# Patient Record
Sex: Male | Born: 1990 | Race: White | Hispanic: No | Marital: Single | Smoking: Never smoker
Health system: Southern US, Community
[De-identification: ages and names within clinical notes are randomized; demographics above are authoritative.]

## PROBLEM LIST (undated history)

## (undated) DIAGNOSIS — I10 Essential (primary) hypertension: Secondary | ICD-10-CM

## (undated) HISTORY — PX: TONSILLECTOMY: SUR1361

---
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
## 2011-12-28 ENCOUNTER — Emergency Department (HOSPITAL_COMMUNITY)
Admission: EM | Admit: 2011-12-28 | Discharge: 2011-12-29 | Disposition: A | Discharge status: Home / Self Care | Payer: Worker's Compensation | Attending: Emergency Medicine | Admitting: Emergency Medicine

## 2011-12-28 ENCOUNTER — Encounter (HOSPITAL_COMMUNITY): Payer: Self-pay | Admitting: Emergency Medicine

## 2011-12-28 ENCOUNTER — Emergency Department (HOSPITAL_COMMUNITY): Payer: Worker's Compensation

## 2011-12-28 DIAGNOSIS — W292XXA Contact with other powered household machinery, initial encounter: Secondary | ICD-10-CM | POA: Insufficient documentation

## 2011-12-28 DIAGNOSIS — Y990 Civilian activity done for income or pay: Secondary | ICD-10-CM | POA: Insufficient documentation

## 2011-12-28 DIAGNOSIS — S61209A Unspecified open wound of unspecified finger without damage to nail, initial encounter: Secondary | ICD-10-CM

## 2011-12-28 DIAGNOSIS — Z23 Encounter for immunization: Secondary | ICD-10-CM | POA: Insufficient documentation

## 2011-12-28 DIAGNOSIS — Y9269 Other specified industrial and construction area as the place of occurrence of the external cause: Secondary | ICD-10-CM | POA: Insufficient documentation

## 2011-12-28 DIAGNOSIS — Y9389 Activity, other specified: Secondary | ICD-10-CM | POA: Insufficient documentation

## 2011-12-28 MED ORDER — "THROMBI-PAD 3""X3"" EX PADS"
1.0000 | MEDICATED_PAD | Freq: Once | CUTANEOUS | Status: AC
  Administered 2011-12-28: 1 via TOPICAL

## 2011-12-28 MED ORDER — TETANUS-DIPHTH-ACELL PERTUSSIS 5-2.5-18.5 LF-MCG/0.5 IM SUSP
0.5000 mL | Freq: Once | INTRAMUSCULAR | Status: AC
  Administered 2011-12-28: 0.5 mL via INTRAMUSCULAR
  Filled 2011-12-28: qty 0.5

## 2011-12-28 NOTE — ED Notes (Signed)
Bleeding controlled at this time.

## 2011-12-28 NOTE — ED Notes (Signed)
Surgicel applied to right hand, index finger. Pt finger wrapped in coban and applied pressure

## 2011-12-28 NOTE — ED Notes (Signed)
MD at bedside. 

## 2011-12-28 NOTE — ED Provider Notes (Signed)
History     CSN: [id number]  Arrival date & time 12/28/11  1478   First MD Initiated Contact with Patient 12/28/11 2048      Chief Complaint  Patient presents with  . Laceration    (Consider location/radiation/quality/duration/timing/severity/associated sxs/prior treatment) HPI History provided by pt.   Pt was using a meat slicer at work this evening, and sustained an avulsion of tuft of right index finger.  Bleeding is not controlled and pain is severe.  No associated paresthesias.  Pt is not anti-coagulated.  Most recent tetanus unknown.  History reviewed. No pertinent past medical history.  History reviewed. No pertinent past surgical history.  No family history on file.  History  Substance Use Topics  . Smoking status: Never Smoker   . Smokeless tobacco: Not on file  . Alcohol Use: No      Review of Systems  All other systems reviewed and are negative.    Allergies  Review of patient's allergies indicates no known allergies.  Home Medications  No current outpatient prescriptions on file.  BP 167/101  Pulse 99  Temp 98.1 F (36.7 C) (Oral)  Resp 24  SpO2 100%  Physical Exam  Nursing note and vitals reviewed. Constitutional: He is oriented to person, place, and time. He appears well-developed and well-nourished. No distress.  HENT:  Head: Normocephalic and atraumatic.  Eyes:       Normal appearance  Neck: Normal range of motion.  Pulmonary/Chest: Effort normal.  Musculoskeletal: Normal range of motion.       Avulsion of tuft of right index finger, ~2cm in diameter, not affecting the nailbed.  Oozing blood. Sensation intact; severely ttp.    Neurological: He is alert and oriented to person, place, and time.  Psychiatric: He has a normal mood and affect. His behavior is normal.    ED Course  Procedures (including critical care time)  Labs Reviewed - No data to display Dg Finger Index Right  12/28/2011  *RADIOLOGY REPORT*  Clinical Data: Soft  tissue injury  RIGHT INDEX FINGER 2+V  Comparison: None.  Findings: There is amputation deformity of the soft tissues of the distal lateral tip of the index finger.  No evidence of fracture. No radiopaque foreign object.  IMPRESSION: Partial soft tissue amputation at the tip of the index finger.   Original Report Authenticated By: Nicolas Adkins, Adkins.D.      1. Avulsion of fingertip       MDM  Healthy Nicolas Adkins presents w/ right index finger soft tissue tuft avulsion.  Xray neg for fx.  Bleeding difficult to control.  We have had patient elevate and hold pressure and applied both qwik clot and thrombi pad w/out improvement.  He is currently holding surgicel on fingertip.  Digital block performed w/ 5mL 2% lidocaine w/out epi.  Tetanus has been updated.  Will reassess shortly.    Bleeding stopped w/ surgicel.  Pt instructed to keep surgicel in place for 24 hours.  Nursing staff applied a pressure dressing.  Pt d/c'd home w/ vicodin for pain.  We discussed what to do if he begins to bleed again as well as return precautions.  12:48 AM      Otilio Miu, PA-C 12/29/11 [phone]

## 2011-12-28 NOTE — ED Notes (Signed)
PT. PRESENTS WITH RIGHT DISTAL INDEX FINGER LACERATION SUSTAINED AT WORK ( [employer]) WHILE USING MEAT SLICER , DRESSINGS APPLIED PTA, NO BLEEDING NOTED.

## 2011-12-28 NOTE — ED Notes (Signed)
Pt has index finger to right hand wrapped with ace bandage and elevated. A&Ox4, ambulatory, nad.

## 2011-12-29 MED ORDER — OXYCODONE-ACETAMINOPHEN 5-325 MG PO TABS
1.0000 | ORAL_TABLET | Freq: Once | ORAL | Status: AC
  Administered 2011-12-29: 1 via ORAL
  Filled 2011-12-29: qty 1

## 2011-12-29 MED ORDER — HYDROCODONE-ACETAMINOPHEN 5-325 MG PO TABS: 1.0000 | ORAL_TABLET | ORAL | Status: AC | PRN

## 2011-12-29 NOTE — ED Provider Notes (Signed)
Medical screening examination/treatment/procedure(s) were performed by non-physician practitioner and as supervising physician I was immediately available for consultation/collaboration.  Flint Melter, MD 12/29/11 [phone]

## 2011-12-29 NOTE — ED Notes (Signed)
RN and PA reviewed patient finger for the bleeding to stop. Bleeding under control. Pressure dressing applied to finger again with surgicel. Pt and family teaching on care when discharged. Pt and family stated they understood all discharge instructions.

## 2014-01-24 IMAGING — CR DG FINGER INDEX 2+V*R*
3 series · 3 of 3 positions shown · non-contrast
Comparison: None.

CLINICAL DATA: Soft tissue injury

RIGHT INDEX FINGER 2+V

[x finger obl. right (1 of 2)]
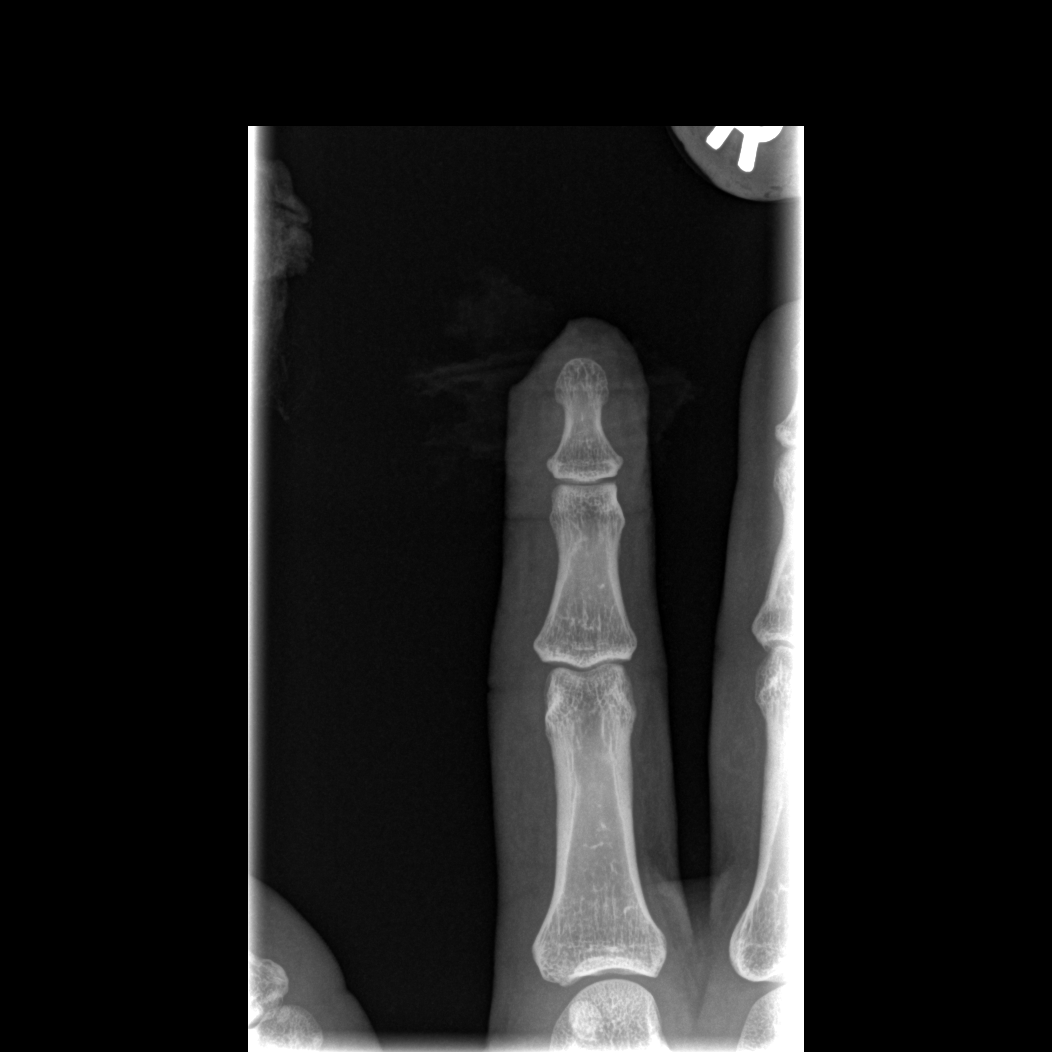

[x finger obl. right (2 of 2)]
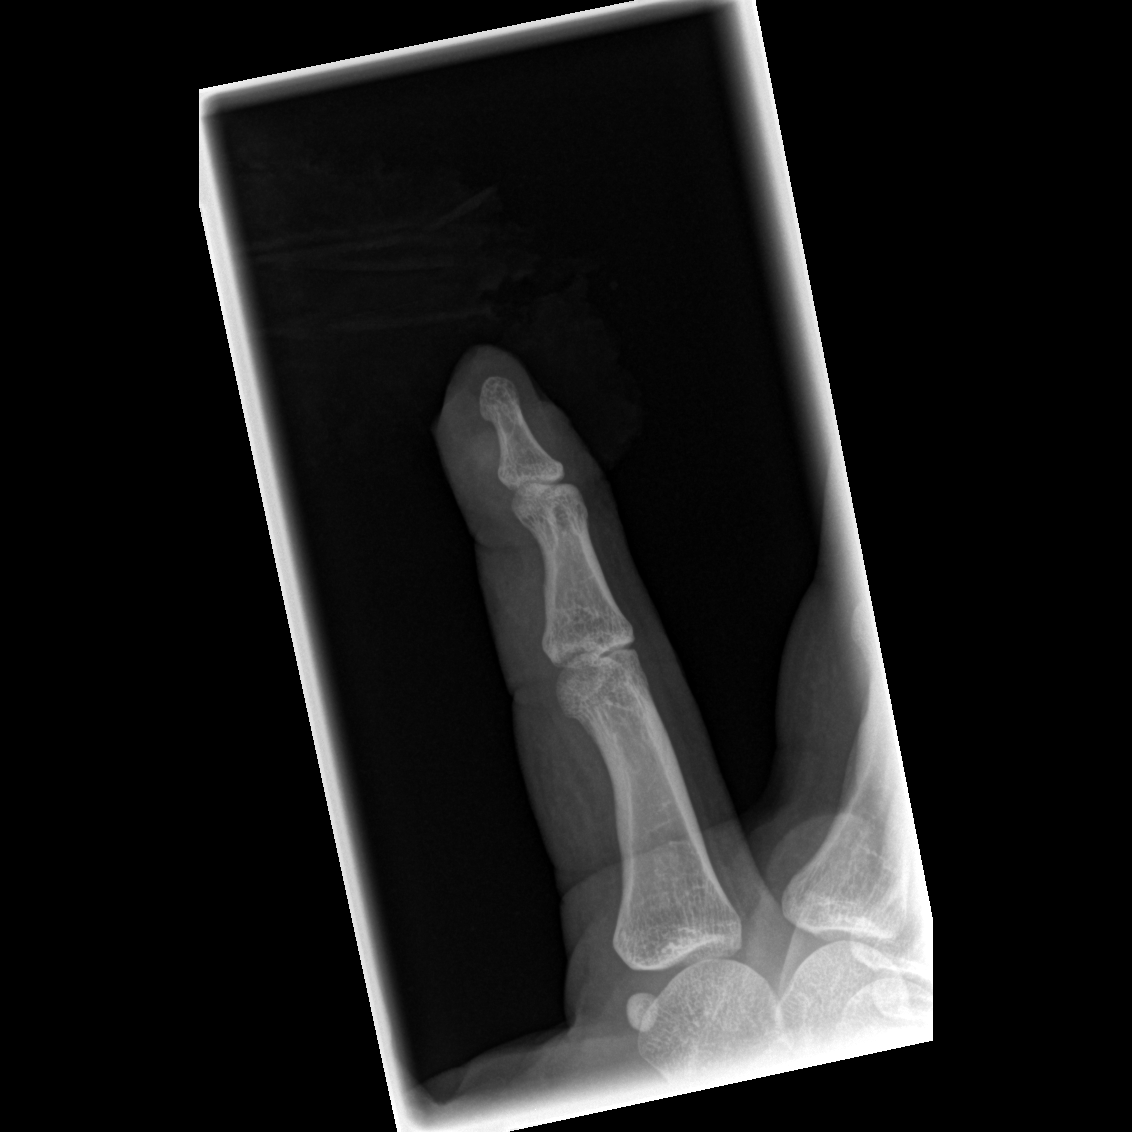

[x finger lateral right]
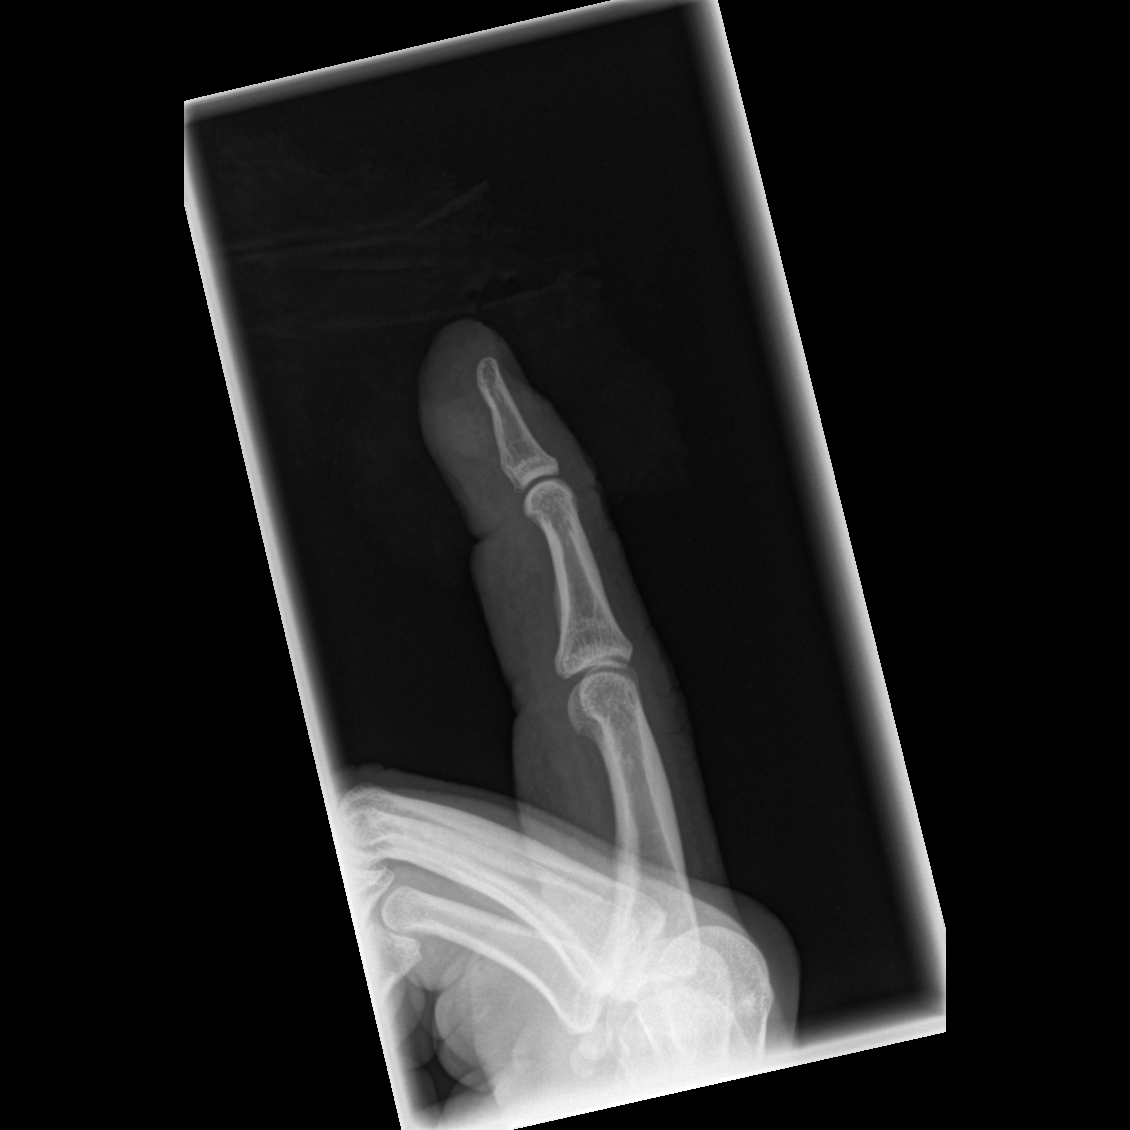

[3 of 3 positions shown; findings below may reference images not displayed]

FINDINGS: There is amputation deformity of the soft tissues of the
distal lateral tip of the index finger.  No evidence of fracture.
No radiopaque foreign object.
IMPRESSION: Partial soft tissue amputation at the tip of the index finger.

## 2014-11-23 ENCOUNTER — Encounter (HOSPITAL_BASED_OUTPATIENT_CLINIC_OR_DEPARTMENT_OTHER): Payer: Self-pay | Admitting: Emergency Medicine

## 2014-11-23 ENCOUNTER — Emergency Department (HOSPITAL_BASED_OUTPATIENT_CLINIC_OR_DEPARTMENT_OTHER)
Admission: EM | Admit: 2014-11-23 | Discharge: 2014-11-23 | Disposition: A | Discharge status: Home / Self Care | Payer: Worker's Compensation | Attending: Emergency Medicine | Admitting: Emergency Medicine

## 2014-11-23 ENCOUNTER — Emergency Department (HOSPITAL_BASED_OUTPATIENT_CLINIC_OR_DEPARTMENT_OTHER): Payer: Worker's Compensation

## 2014-11-23 DIAGNOSIS — W1839XA Other fall on same level, initial encounter: Secondary | ICD-10-CM | POA: Diagnosis not present

## 2014-11-23 DIAGNOSIS — I10 Essential (primary) hypertension: Secondary | ICD-10-CM | POA: Diagnosis not present

## 2014-11-23 DIAGNOSIS — Y9289 Other specified places as the place of occurrence of the external cause: Secondary | ICD-10-CM | POA: Diagnosis not present

## 2014-11-23 DIAGNOSIS — S8992XA Unspecified injury of left lower leg, initial encounter: Secondary | ICD-10-CM | POA: Diagnosis present

## 2014-11-23 DIAGNOSIS — S8002XA Contusion of left knee, initial encounter: Secondary | ICD-10-CM

## 2014-11-23 DIAGNOSIS — Y990 Civilian activity done for income or pay: Secondary | ICD-10-CM | POA: Insufficient documentation

## 2014-11-23 DIAGNOSIS — Y9339 Activity, other involving climbing, rappelling and jumping off: Secondary | ICD-10-CM | POA: Diagnosis not present

## 2014-11-23 HISTORY — DX: Essential (primary) hypertension: I10

## 2014-11-23 NOTE — ED Provider Notes (Signed)
CSN: [id number]     Arrival date & time 11/23/14  2011 History  By signing my name below, I, Octavia Heirrianna Nassar, attest that this documentation has been prepared under the direction and in the presence of Tilden FossaElizabeth Tennis Mckinnon, MD. Electronically Signed: Octavia HeirArianna Nassar, ED Scribe. 11/23/2014. 10:31 PM.    Chief Complaint  Patient presents with  . Leg Injury    left      The history is provided by the patient. No language interpreter was used.   HPI Comments: Nicolas NordmannChristopher A Adkins is a 24 y.o. male who presents to the Emergency Department complaining of a constant, sudden, gradual worsening left knee injury onset about 3 hours ago. Pt states he was at work when he jumped off of a piece of equipment and landed directly on concrete. He states he is unable to apply pressure and reports increased pain when ambulating. Pt denies any prior injury to his knee.   Past Medical History  Diagnosis Date  . Hypertension    Past Surgical History  Procedure Laterality Date  . Tonsillectomy     History reviewed. No pertinent family history. Social History  Substance Use Topics  . Smoking status: Never Smoker   . Smokeless tobacco: None  . Alcohol Use: No    Review of Systems  Musculoskeletal: Positive for arthralgias.  All other systems reviewed and are negative.     Allergies  Review of patient's allergies indicates no known allergies.  Home Medications   Prior to Admission medications   Medication Sig Start Date End Date Taking? Authorizing Provider  HYDROcodone-acetaminophen (NORCO/VICODIN) 5-325 MG per tablet Take 1 tablet by mouth every 4 (four) hours as needed for pain. 12/29/11   Ruby Colaatherine Schinlever, PA-C   Triage vitals: BP 149/103 mmHg  Pulse 67  Temp(Src) 97.8 F (36.6 C)  Resp 20  Ht 5\' 10"  (1.778 m)  Wt 210 lb (95.255 kg)  BMI 30.13 kg/m2  SpO2 97% Physical Exam  Constitutional: He is oriented to person, place, and time. He appears well-developed and well-nourished.  HENT:   Head: Normocephalic and atraumatic.  Cardiovascular: Normal rate and regular rhythm.   Pulmonary/Chest: Effort normal. No respiratory distress.  Musculoskeletal: He exhibits edema and tenderness.  Swelling and tenderness to the anterior left knee, 2+ DP pulses.  Able to fully extend knee.  Pain with weight bearing.    Neurological: He is alert and oriented to person, place, and time.  Skin: Skin is warm and dry.  Psychiatric: He has a normal mood and affect. His behavior is normal.  Nursing note and vitals reviewed.   ED Course  Procedures  DIAGNOSTIC STUDIES: Oxygen Saturation is 97% on RA, normal by my interpretation.  COORDINATION OF CARE:  10:24 PM Discussed treatment plan with pt at bedside and pt agreed to plan.  Labs Review Labs Reviewed - No data to display  Imaging Review Dg Knee Complete 4 Views Left  11/23/2014  CLINICAL DATA:  Fall with injury of the left knee tonight. Abrasions along the patella. EXAM: LEFT KNEE - COMPLETE 4+ VIEW COMPARISON:  None. FINDINGS: Prepatellar stranding compatible with subcutaneous edema. Suprapatellar bursa indistinct but I do not see a definite knee effusion. No fracture identified. IMPRESSION: 1. Prepatellar edema. No discrete fracture. No definite knee effusion. Electronically Signed   By: Gaylyn RongWalter  Liebkemann M.D.   On: 11/23/2014 22:12   I have personally reviewed and evaluated these images and lab results as part of my medical decision-making.   EKG Interpretation None  MDM   Final diagnoses:  Knee contusion, left, initial encounter   Patient here for evaluation of left knee injury at work. There is no evidence of acute fracture or dislocation on examination. He does have local tenderness with range of motion and weightbearing. We'll place an knee immobilizer with crutches for weightbearing as tolerated and outpatient follow-up.  I personally performed the services described in this documentation, which was scribed in my  presence. The recorded information has been reviewed and is accurate.   Tilden Fossa, MD 11/24/14 0001

## 2014-11-23 NOTE — ED Notes (Signed)
Patient states he was at work and a plastic pallettes fell towards him and he jump to get away from them and fell on the concrete with his left knee.

## 2014-11-23 NOTE — Discharge Instructions (Signed)

## 2016-12-20 IMAGING — DX DG KNEE COMPLETE 4+V*L*
4 series · 4 of 4 positions shown · non-contrast
Comparison: None.

CLINICAL DATA: Fall with injury of the left knee tonight. Abrasions
along the patella.

EXAM:
LEFT KNEE - COMPLETE 4+ VIEW

[knee ap]
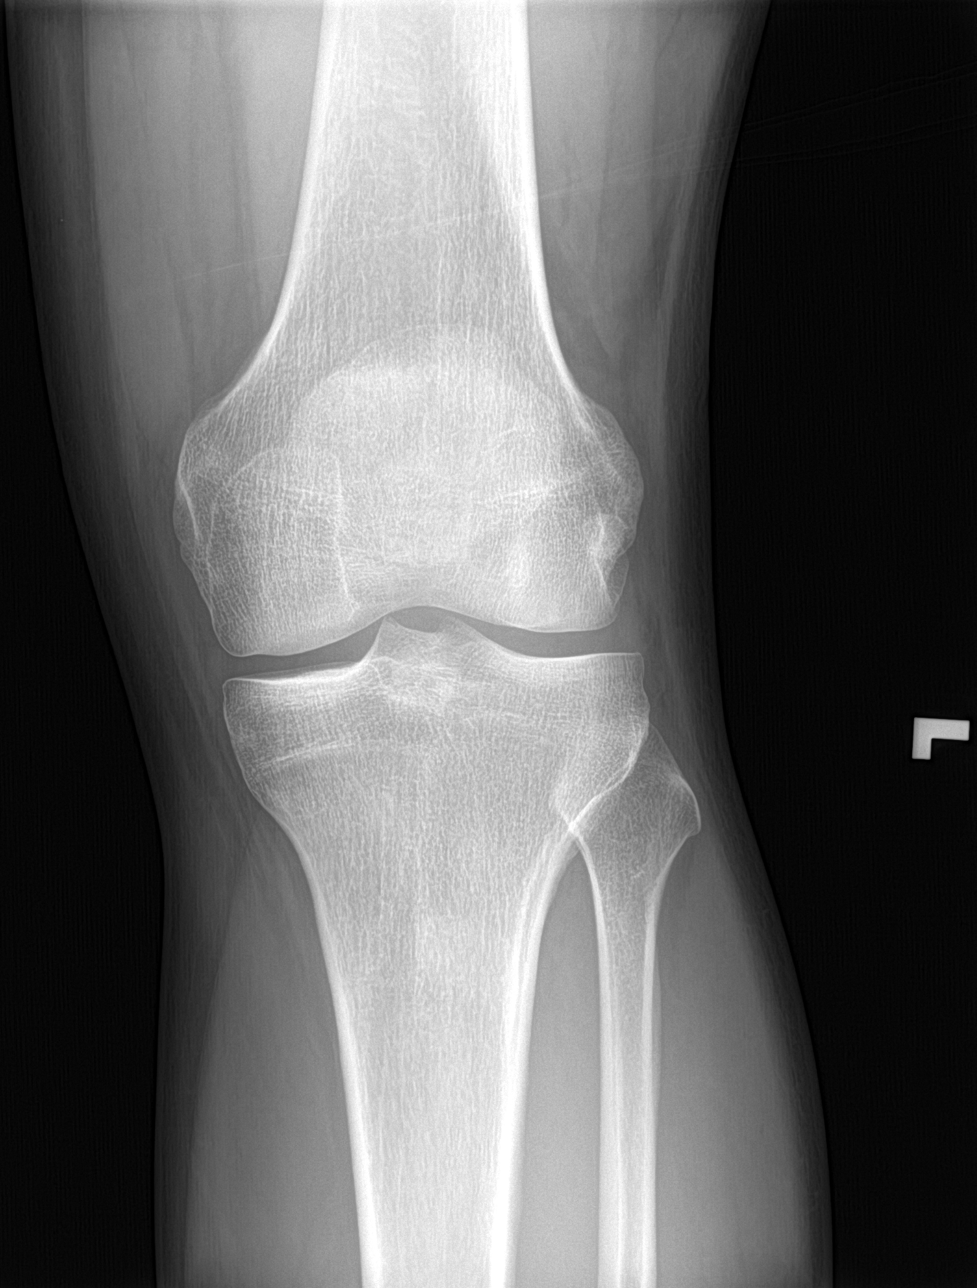

[knee lat]
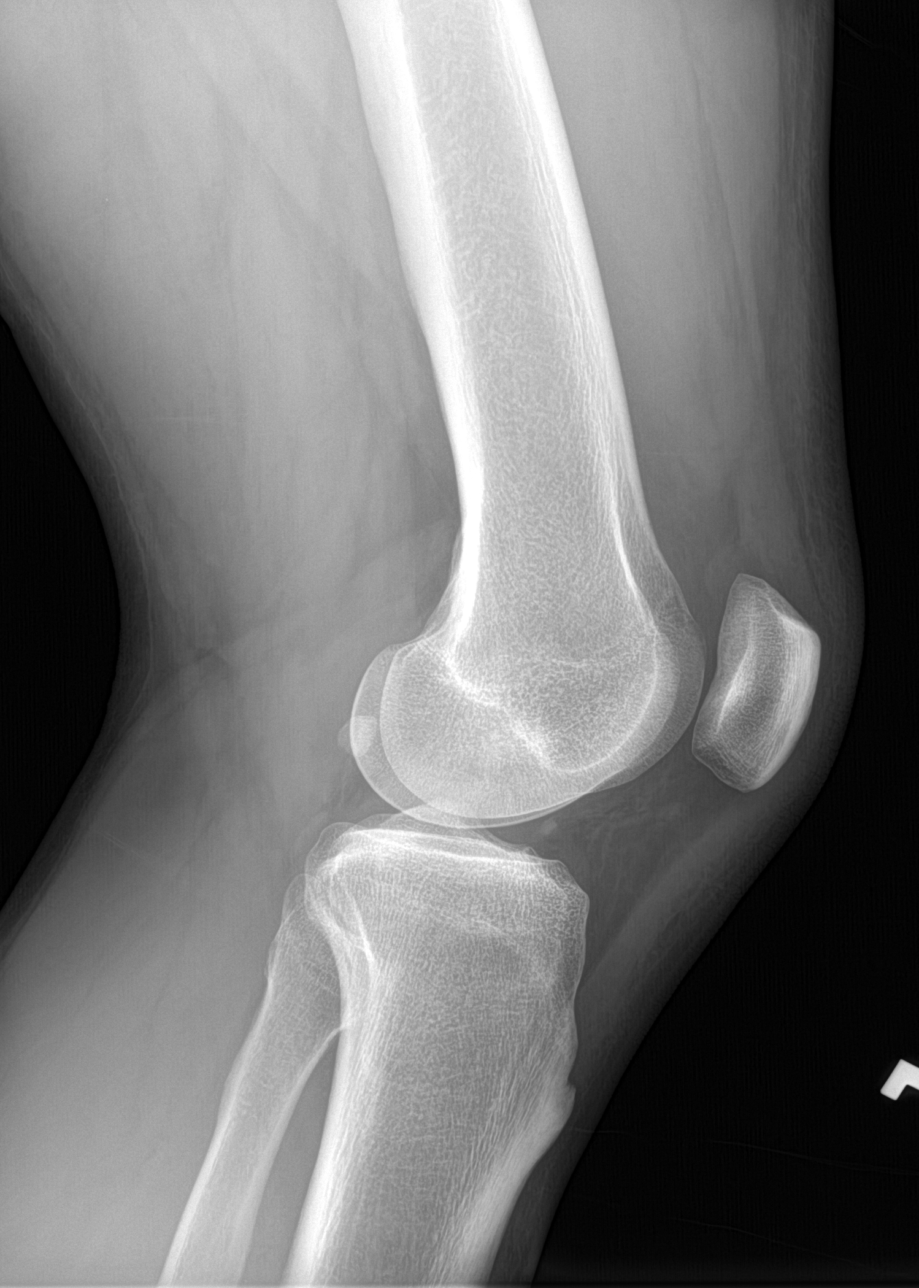

[knee obl (1 of 2)]
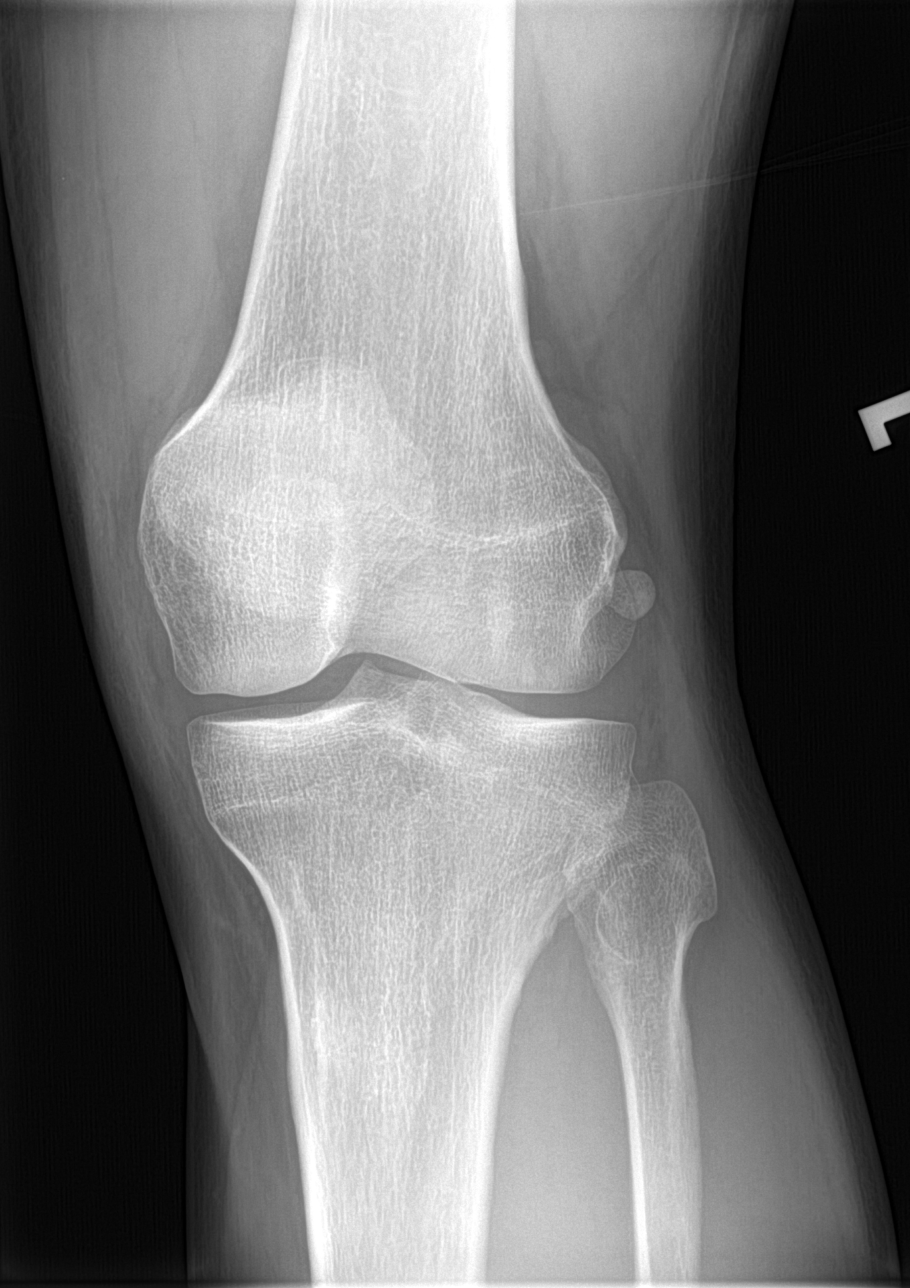

[knee obl (2 of 2)]
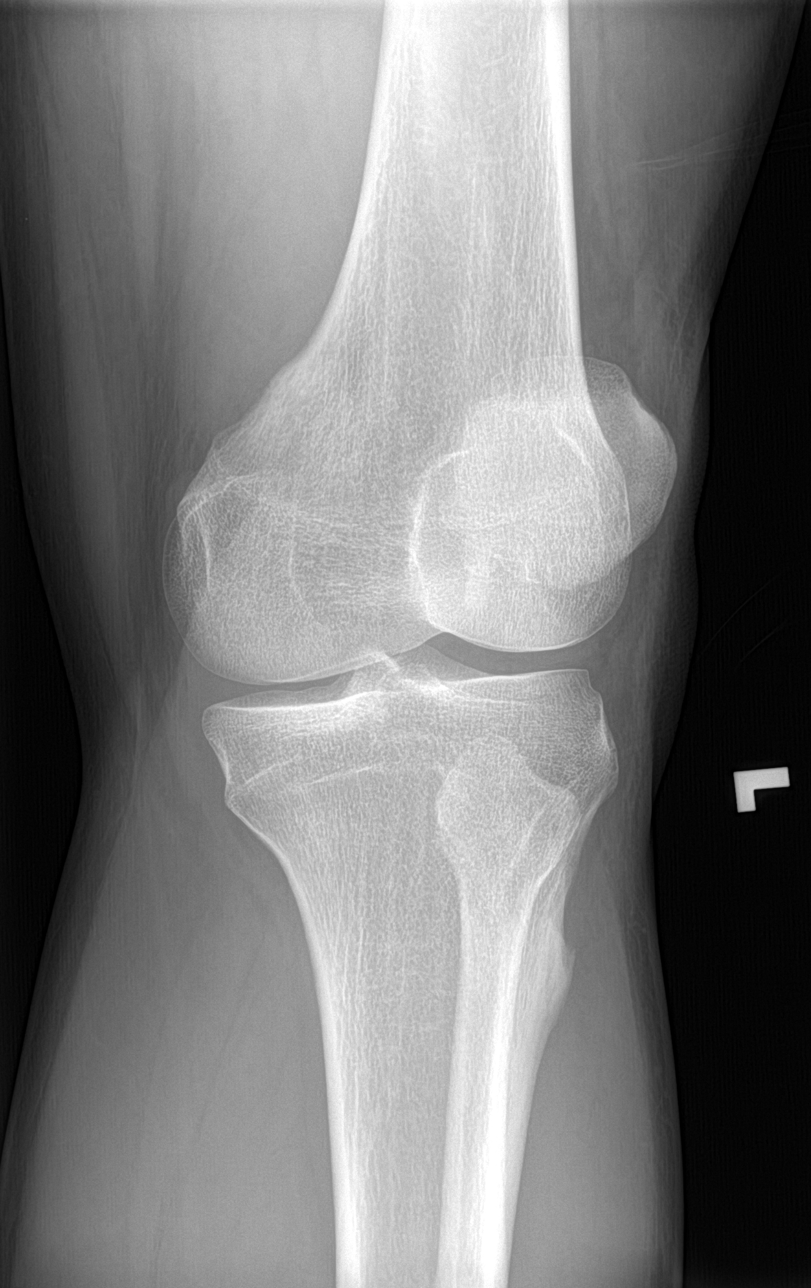

[4 of 4 positions shown; findings below may reference images not displayed]

FINDINGS: Prepatellar stranding compatible with subcutaneous edema.
Suprapatellar bursa indistinct but I do not see a definite knee
effusion. No fracture identified.
IMPRESSION: 1. Prepatellar edema. No discrete fracture. No definite knee
effusion.

## 2017-01-18 ENCOUNTER — Emergency Department (HOSPITAL_BASED_OUTPATIENT_CLINIC_OR_DEPARTMENT_OTHER)
Admission: EM | Admit: 2017-01-18 | Discharge: 2017-01-19 | Disposition: A | Discharge status: Home / Self Care | Payer: Worker's Compensation | Attending: Emergency Medicine | Admitting: Emergency Medicine

## 2017-01-18 ENCOUNTER — Encounter (HOSPITAL_BASED_OUTPATIENT_CLINIC_OR_DEPARTMENT_OTHER): Payer: Self-pay | Admitting: *Deleted

## 2017-01-18 ENCOUNTER — Other Ambulatory Visit: Payer: Self-pay

## 2017-01-18 DIAGNOSIS — S6992XA Unspecified injury of left wrist, hand and finger(s), initial encounter: Secondary | ICD-10-CM

## 2017-01-18 DIAGNOSIS — S61412D Laceration without foreign body of left hand, subsequent encounter: Secondary | ICD-10-CM | POA: Insufficient documentation

## 2017-01-18 DIAGNOSIS — R202 Paresthesia of skin: Secondary | ICD-10-CM | POA: Diagnosis not present

## 2017-01-18 DIAGNOSIS — I10 Essential (primary) hypertension: Secondary | ICD-10-CM | POA: Diagnosis not present

## 2017-01-18 DIAGNOSIS — X58XXXA Exposure to other specified factors, initial encounter: Secondary | ICD-10-CM | POA: Diagnosis not present

## 2017-01-18 NOTE — ED Triage Notes (Signed)
Pt c/o laceration to left hand , x 11 hrs ago , seen by UC and sutrues placed, c/o numbness to fingers

## 2017-01-19 LAB — CBC WITH DIFFERENTIAL/PLATELET
Basophils Absolute: 0 10*3/uL (ref 0.0–0.1)
Basophils Relative: 0 %
EOS ABS: 0.2 10*3/uL (ref 0.0–0.7)
EOS PCT: 2 %
HCT: 44.8 % (ref 39.0–52.0)
Hemoglobin: 15.6 g/dL (ref 13.0–17.0)
Lymphocytes Relative: 28 %
Lymphs Abs: 3.1 10*3/uL (ref 0.7–4.0)
MCH: 30.7 pg (ref 26.0–34.0)
MCHC: 34.8 g/dL (ref 30.0–36.0)
MCV: 88.2 fL (ref 78.0–100.0)
MONOS PCT: 11 %
Monocytes Absolute: 1.2 10*3/uL — ABNORMAL HIGH (ref 0.1–1.0)
Neutro Abs: 6.6 10*3/uL (ref 1.7–7.7)
Neutrophils Relative %: 59 %
PLATELETS: 315 10*3/uL (ref 150–400)
RBC: 5.08 MIL/uL (ref 4.22–5.81)
RDW: 13 % (ref 11.5–15.5)
WBC: 11.1 10*3/uL — ABNORMAL HIGH (ref 4.0–10.5)

## 2017-01-19 LAB — SALICYLATE LEVEL

## 2017-01-19 LAB — COMPREHENSIVE METABOLIC PANEL
ALK PHOS: 73 U/L (ref 38–126)
ALT: 66 U/L — ABNORMAL HIGH (ref 17–63)
ANION GAP: 8 (ref 5–15)
AST: 36 U/L (ref 15–41)
Albumin: 4.2 g/dL (ref 3.5–5.0)
BUN: 19 mg/dL (ref 6–20)
CALCIUM: 9.1 mg/dL (ref 8.9–10.3)
CO2: 26 mmol/L (ref 22–32)
Chloride: 105 mmol/L (ref 101–111)
Creatinine, Ser: 1.1 mg/dL (ref 0.61–1.24)
GFR calc non Af Amer: >60 mL/min (ref >60)
Glucose, Bld: 93 mg/dL (ref 65–99)
Potassium: 3.9 mmol/L (ref 3.5–5.1)
SODIUM: 139 mmol/L (ref 135–145)
Total Bilirubin: 0.7 mg/dL (ref 0.3–1.2)
Total Protein: 7.2 g/dL (ref 6.5–8.1)

## 2017-01-19 LAB — ACETAMINOPHEN LEVEL

## 2017-01-19 MED ORDER — ACETAMINOPHEN 325 MG PO TABS
650.0000 mg | ORAL_TABLET | Freq: Once | ORAL | Status: AC
  Administered 2017-01-19: 650 mg via ORAL
  Filled 2017-01-19: qty 2

## 2017-01-19 MED ORDER — TRAMADOL HCL 50 MG PO TABS: 50.0000 mg | ORAL_TABLET | Freq: Four times a day (QID) | ORAL | 0 refills | Status: AC | PRN

## 2017-01-19 NOTE — ED Notes (Signed)
Pt was seen today at [facility] in [place] following a deep laceration that occurred while pulling furniture.  The laceration required sutures and currently looks good, no signs of infections, wound approximated and no drainage.  Pt returns because he is having increased pain and numbness and tingling that move up his arm.  Pt is able to move all digits on command.  He states he would like to make sure he doesn't have nerve damage.  Pt also disclosed that d/t the pain, he elected to take 4000 mg of ibuprofen between 1700 and 1800.  Pt is advised that this was entirely too much ibuprofen and the effects on his kidneys could be toxic.  Pt and significant other at the bedside verbalize understanding and they are also informed that he should not take anymore NSAIDS until advised when EDP evaluates him.  Pt given ice pack for pain and some tylenol.

## 2017-01-19 NOTE — ED Provider Notes (Signed)
[facility] EMERGENCY DEPARTMENT Provider Note   CSN: [id number] Arrival date & time: 01/18/17  2336     History   Chief Complaint Chief Complaint  Patient presents with  . Hand Injury    HPI Nicolas Adkins is a 27 y.o. male who presents for hand pain.  Suffered a laceration yesterday and had his hand sewn up at an urgent care and [place].  He states that he still has numbness in his left 4 fingers and complains of pain shooting up his arm.  He took 4000 mg of ibuprofen with him 30 minutes because of the pain in his hand around 5 PM.  He then went out and had for he does for dinner and came to the emergency room.  The patient is able to move his fingers but complains of pain.  He denies weakness.   HPI  Past Medical History:  Diagnosis Date  . Hypertension     There are no active problems to display for this patient.   Past Surgical History:  Procedure Laterality Date  . TONSILLECTOMY         Home Medications    Prior to Admission medications   Medication Sig Start Date End Date Taking? Authorizing Provider  HYDROcodone-acetaminophen (NORCO/VICODIN) 5-325 MG per tablet Take 1 tablet by mouth every 4 (four) hours as needed for pain. 12/29/11   Schinlever, Santina Evansatherine, PA-C  traMADol (ULTRAM) 50 MG tablet Take 1 tablet (50 mg total) by mouth every 6 (six) hours as needed. 01/19/17   Arthor CaptainHarris, Reesa Gotschall, PA-C    Family History History reviewed. No pertinent family history.  Social History Social History   Tobacco Use  . Smoking status: Never Smoker  . Smokeless tobacco: Never Used  Substance Use Topics  . Alcohol use: No  . Drug use: No     Allergies   Patient has no known allergies.   Review of Systems Review of Systems Ten systems reviewed and are negative for acute change, except as noted in the HPI.   Physical Exam Updated Vital Signs BP (!) 164/113 (BP Location: Right Arm)   Pulse 74   Temp 98 F (36.7 C) (Oral)   Resp 16   Ht 5'  10" (1.778 m)   Wt 98.9 kg (218 lb)   SpO2 100%   BMI 31.28 kg/m   Physical Exam Physical Exam  Nursing note and vitals reviewed. Constitutional: He appears well-developed and well-nourished. No distress.  HENT:  Head: Normocephalic and atraumatic.  Eyes: Conjunctivae normal are normal. No scleral icterus.  Neck: Normal range of motion. Neck supple.  Cardiovascular: Normal rate, regular rhythm and normal heart sounds.   Pulmonary/Chest: Effort normal and breath sounds normal. No respiratory distress.  Abdominal: Soft. There is no tenderness.  Musculoskeletal: He exhibits no edema. There is a laceration status post suture repair on the left dorsal hand, no erythema or signs of infection.  Patient is able to extend the fingers at each joint, full range of motion of the hand, wrist, ipsilateral elbow and shoulder.  Tenderness to palpation.  Normal grip strength Neurological: He is alert.  Skin: Skin is warm and dry. He is not diaphoretic.  Psychiatric: His behavior is normal.     ED Treatments / Results  Labs (all labs ordered are listed, but only abnormal results are displayed) Labs Reviewed  CBC WITH DIFFERENTIAL/PLATELET - Abnormal; Notable for the following components:      Result Value   WBC 11.1 (*)  Monocytes Absolute 1.2 (*)    All other components within normal limits  COMPREHENSIVE METABOLIC PANEL - Abnormal; Notable for the following components:   ALT 66 (*)    All other components within normal limits  ACETAMINOPHEN LEVEL - Abnormal; Notable for the following components:   Acetaminophen (Tylenol), Serum <10 (*)    All other components within normal limits  SALICYLATE LEVEL    EKG  EKG Interpretation None       Radiology No results found.  Procedures Procedures (including critical care time)  Medications Ordered in ED Medications  acetaminophen (TYLENOL) tablet 650 mg (650 mg Oral Given 01/19/17 0030)    SPLINT APPLICATION Date/Time: 1:56  AM Authorized by: Arthor Captain Consent: Verbal consent obtained. Risks and benefits: risks, benefits and alternatives were discussed Consent given by: patient Splint applied by: orthopedic technician Location details: Left hand and wrist Splint type: Wrist splint Post-procedure: The splinted body part was neurovascularly unchanged following the procedure. Patient tolerance: Patient tolerated the procedure well with no immediate complications.    Initial Impression / Assessment and Plan / ED Course  I have reviewed the triage vital signs and the nursing notes.  Pertinent labs & imaging results that were available during my care of the patient were reviewed by me and considered in my medical decision making (see chart for details).     Patient well under the toxic limit of NSAID medication. Splint applied to reduce movement of the hand.  No signs of infection.  Patient will be given medications for pain management.  He may follow-up with a hand surgeon.  He appears otherwise appropriate for discharge at this time  Final Clinical Impressions(s) / ED Diagnoses   Final diagnoses:  Hand injury, left, initial encounter  Paresthesia    ED Discharge Orders        Ordered    traMADol (ULTRAM) 50 MG tablet  Every 6 hours PRN     01/19/17 0155       Arthor Captain, PA-C 01/19/17 0156    Ward, Layla Maw, DO 01/19/17 8119

## 2017-01-19 NOTE — Discharge Instructions (Signed)
Get help right away if: °You develop severe swelling around the wound. °Your pain suddenly increases and is severe. °You develop painful lumps near the wound or on skin that is anywhere on your body. °You have a red streak going away from your wound. °The wound is on your hand or foot and you cannot properly move a finger or toe. °The wound is on your hand or foot and you notice that your fingers or toes look pale or bluish. °

## 2017-01-19 NOTE — ED Notes (Signed)
Pt verbalizes understanding of d/c instructions and denies any further needs at this time. 

## 2017-09-23 ENCOUNTER — Other Ambulatory Visit: Payer: Self-pay | Admitting: Nurse Practitioner

## 2017-09-23 ENCOUNTER — Ambulatory Visit
Admission: RE | Admit: 2017-09-23 | Discharge: 2017-09-23 | Disposition: A | Discharge status: Home / Self Care | Payer: BLUE CROSS/BLUE SHIELD | Source: Ambulatory Visit | Attending: Nurse Practitioner | Admitting: Nurse Practitioner

## 2017-09-23 DIAGNOSIS — M79671 Pain in right foot: Secondary | ICD-10-CM

## 2022-06-09 ENCOUNTER — Emergency Department (HOSPITAL_BASED_OUTPATIENT_CLINIC_OR_DEPARTMENT_OTHER)
Admission: EM | Admit: 2022-06-09 | Discharge: 2022-06-09 | Disposition: A | Discharge status: Home / Self Care | Payer: Medicaid Other | Attending: Emergency Medicine | Admitting: Emergency Medicine

## 2022-06-09 ENCOUNTER — Other Ambulatory Visit: Payer: Self-pay

## 2022-06-09 ENCOUNTER — Emergency Department (HOSPITAL_BASED_OUTPATIENT_CLINIC_OR_DEPARTMENT_OTHER): Payer: Medicaid Other

## 2022-06-09 ENCOUNTER — Encounter (HOSPITAL_BASED_OUTPATIENT_CLINIC_OR_DEPARTMENT_OTHER): Payer: Self-pay

## 2022-06-09 DIAGNOSIS — K529 Noninfective gastroenteritis and colitis, unspecified: Secondary | ICD-10-CM

## 2022-06-09 DIAGNOSIS — D72829 Elevated white blood cell count, unspecified: Secondary | ICD-10-CM | POA: Diagnosis not present

## 2022-06-09 DIAGNOSIS — R1084 Generalized abdominal pain: Secondary | ICD-10-CM | POA: Insufficient documentation

## 2022-06-09 DIAGNOSIS — R109 Unspecified abdominal pain: Secondary | ICD-10-CM | POA: Diagnosis present

## 2022-06-09 LAB — URINALYSIS, ROUTINE W REFLEX MICROSCOPIC
Bilirubin Urine: NEGATIVE
Glucose, UA: NEGATIVE mg/dL
Hgb urine dipstick: NEGATIVE
Ketones, ur: NEGATIVE mg/dL
Leukocytes,Ua: NEGATIVE
Nitrite: NEGATIVE
Protein, ur: NEGATIVE mg/dL
Specific Gravity, Urine: >=1.03 (ref 1.005–1.030)
pH: 5.5 (ref 5.0–8.0)

## 2022-06-09 LAB — COMPREHENSIVE METABOLIC PANEL
ALT: 89 U/L — ABNORMAL HIGH (ref 0–44)
AST: 57 U/L — ABNORMAL HIGH (ref 15–41)
Albumin: 3.9 g/dL (ref 3.5–5.0)
Alkaline Phosphatase: 67 U/L (ref 38–126)
Anion gap: 10 (ref 5–15)
BUN: 11 mg/dL (ref 6–20)
CO2: 26 mmol/L (ref 22–32)
Calcium: 9.1 mg/dL (ref 8.9–10.3)
Chloride: 102 mmol/L (ref 98–111)
Creatinine, Ser: 0.81 mg/dL (ref 0.61–1.24)
GFR, Estimated: >60 mL/min (ref >60)
Glucose, Bld: 125 mg/dL — ABNORMAL HIGH (ref 70–99)
Potassium: 3.8 mmol/L (ref 3.5–5.1)
Sodium: 138 mmol/L (ref 135–145)
Total Bilirubin: 0.6 mg/dL (ref 0.3–1.2)
Total Protein: 7.7 g/dL (ref 6.5–8.1)

## 2022-06-09 LAB — CBC
HCT: 41.5 % (ref 39.0–52.0)
Hemoglobin: 14.3 g/dL (ref 13.0–17.0)
MCH: 30.4 pg (ref 26.0–34.0)
MCHC: 34.5 g/dL (ref 30.0–36.0)
MCV: 88.3 fL (ref 80.0–100.0)
Platelets: 392 10*3/uL (ref 150–400)
RBC: 4.7 MIL/uL (ref 4.22–5.81)
RDW: 12.5 % (ref 11.5–15.5)
WBC: 17 10*3/uL — ABNORMAL HIGH (ref 4.0–10.5)
nRBC: 0 % (ref 0.0–0.2)

## 2022-06-09 LAB — LIPASE, BLOOD: Lipase: 25 U/L (ref 11–51)

## 2022-06-09 MED ORDER — SODIUM CHLORIDE 0.9 % IV BOLUS
1000.0000 mL | Freq: Once | INTRAVENOUS | Status: AC
  Administered 2022-06-09: 1000 mL via INTRAVENOUS

## 2022-06-09 MED ORDER — ONDANSETRON HCL 4 MG/2ML IJ SOLN
4.0000 mg | Freq: Once | INTRAMUSCULAR | Status: AC
  Administered 2022-06-09: 4 mg via INTRAVENOUS
  Filled 2022-06-09: qty 2

## 2022-06-09 MED ORDER — KETOROLAC TROMETHAMINE 30 MG/ML IJ SOLN
30.0000 mg | Freq: Once | INTRAMUSCULAR | Status: AC
  Administered 2022-06-09: 30 mg via INTRAVENOUS
  Filled 2022-06-09: qty 1

## 2022-06-09 MED ORDER — ONDANSETRON 8 MG PO TBDP: ORAL_TABLET | ORAL | 0 refills | Status: AC

## 2022-06-09 MED ORDER — IOHEXOL 300 MG/ML  SOLN
100.0000 mL | Freq: Once | INTRAMUSCULAR | Status: AC | PRN
  Administered 2022-06-09: 100 mL via INTRAVENOUS

## 2022-06-09 NOTE — ED Provider Notes (Signed)
[facility] AT [facility] Provider Note   CSN: [id number] Arrival date & time: 06/09/22  0152     History  Chief Complaint  Patient presents with   Abdominal Pain    Nicolas Adkins is a 32 y.o. male.  Patient is a 32 year old male presenting with complaints of abdominal pain, nausea, and vomiting.  This started on Monday (5 days ago) and has persisted.  He describes very little p.o. intake during the past 5 days.  He denies to me he is having any diarrhea or constipation, but has not had a bowel movement in the last 2-1/2 days.  No prior abdominal surgeries.  No ill contacts and denies having consumed any undercooked or suspicious foods.  No fevers or chills.  No aggravating or alleviating factors.  He reports having to leave work this evening due to these issues.  The history is provided by the patient.       Home Medications Prior to Admission medications   Medication Sig Start Date End Date Taking? Authorizing Provider  HYDROcodone-acetaminophen (NORCO/VICODIN) 5-325 MG per tablet Take 1 tablet by mouth every 4 (four) hours as needed for pain. 12/29/11   Schinlever, Santina Evans, PA-C  traMADol (ULTRAM) 50 MG tablet Take 1 tablet (50 mg total) by mouth every 6 (six) hours as needed. 01/19/17   Arthor Captain, PA-C      Allergies    Molds & smuts    Review of Systems   Review of Systems  All other systems reviewed and are negative.   Physical Exam Updated Vital Signs BP (!) 145/106   Pulse 74   Temp 97.7 F (36.5 C) (Oral)   Resp 17   Ht 5\' 9"  (1.753 m)   Wt 117.9 kg   SpO2 100%   BMI 38.40 kg/m  Physical Exam Vitals and nursing note reviewed.  Constitutional:      General: He is not in acute distress.    Appearance: He is well-developed. He is not diaphoretic.  HENT:     Head: Normocephalic and atraumatic.  Cardiovascular:     Rate and Rhythm: Normal rate and regular rhythm.     Heart sounds: No murmur heard.    No  friction rub.  Pulmonary:     Effort: Pulmonary effort is normal. No respiratory distress.     Breath sounds: Normal breath sounds. No wheezing or rales.  Abdominal:     General: Bowel sounds are normal. There is no distension.     Palpations: Abdomen is soft.     Tenderness: There is generalized abdominal tenderness. There is no right CVA tenderness, left CVA tenderness, guarding or rebound.  Musculoskeletal:        General: Normal range of motion.     Cervical back: Normal range of motion and neck supple.  Skin:    General: Skin is warm and dry.  Neurological:     Mental Status: He is alert and oriented to person, place, and time.     Coordination: Coordination normal.     ED Results / Procedures / Treatments   Labs (all labs ordered are listed, but only abnormal results are displayed) Labs Reviewed  CBC - Abnormal; Notable for the following components:      Result Value   WBC 17.0 (*)    All other components within normal limits  URINALYSIS, ROUTINE W REFLEX MICROSCOPIC  LIPASE, BLOOD  COMPREHENSIVE METABOLIC PANEL    EKG None  Radiology No results found.  Procedures Procedures    Medications Ordered in ED Medications  sodium chloride 0.9 % bolus 1,000 mL (has no administration in time range)  ondansetron (ZOFRAN) injection 4 mg (has no administration in time range)  ketorolac (TORADOL) 30 MG/ML injection 30 mg (has no administration in time range)    ED Course/ Medical Decision Making/ A&P  Patient is a 32 year old male presenting with nausea and vomiting for the past 5 days.  Is also describes generalized abdominal cramping.  Patient arrives here with stable vital signs and is afebrile.  On exam, he does have generalized abdominal tenderness, but no peritoneal signs.  Workup initiated including CBC, CMP, and lipase.  He does have a leukocytosis with white count of 17,000 and mild elevations of his transaminases, but laboratory studies are otherwise  unremarkable.  Urinalysis is clear.  CT scan of the abdomen and pelvis obtained showing no acute intra-abdominal process.  Patient was given IV fluids along with Toradol for pain and Zofran for nausea.  He seems to be feeling better.  At this point, I feel as though he can safely be discharged.  I will prescribe Zofran which he can take as needed for nausea and is to return as needed if symptoms worsen.  Because of the nausea and vomiting is unclear, but I do suspect a viral etiology or possibly foodborne illness.  Final Clinical Impression(s) / ED Diagnoses Final diagnoses:  None    Rx / DC Orders ED Discharge Orders     None         Geoffery Lyons, MD 06/09/22 [phone]

## 2022-06-09 NOTE — ED Triage Notes (Signed)
ABD pain, nausea, vomiting since Monday.

## 2022-06-09 NOTE — Discharge Instructions (Signed)
Take Zofran as prescribed as needed for nausea.  Clear liquid diet for the next 12 hours, then slowly advance to normal as tolerated.  Return to the emergency department if you develop severe abdominal pain, high fevers, bloody stools, or for other new and concerning symptoms.
